# Patient Record
Sex: Male | Born: 1980
Health system: Southern US, Community
[De-identification: ages and names within clinical notes are randomized; demographics above are authoritative.]

## PROBLEM LIST (undated history)

## (undated) HISTORY — PX: SKIN GRAFT: SHX250

---

## 2012-07-24 ENCOUNTER — Emergency Department (HOSPITAL_BASED_OUTPATIENT_CLINIC_OR_DEPARTMENT_OTHER): Payer: Self-pay

## 2012-07-24 ENCOUNTER — Emergency Department (HOSPITAL_BASED_OUTPATIENT_CLINIC_OR_DEPARTMENT_OTHER)
Admission: EM | Admit: 2012-07-24 | Discharge: 2012-07-24 | Disposition: A | Discharge status: Home / Self Care | Payer: No Typology Code available for payment source | Attending: Emergency Medicine | Admitting: Emergency Medicine

## 2012-07-24 ENCOUNTER — Encounter (HOSPITAL_BASED_OUTPATIENT_CLINIC_OR_DEPARTMENT_OTHER): Payer: Self-pay | Admitting: *Deleted

## 2012-07-24 DIAGNOSIS — T07XXXA Unspecified multiple injuries, initial encounter: Secondary | ICD-10-CM

## 2012-07-24 DIAGNOSIS — T24339A Burn of third degree of unspecified lower leg, initial encounter: Secondary | ICD-10-CM | POA: Insufficient documentation

## 2012-07-24 DIAGNOSIS — L089 Local infection of the skin and subcutaneous tissue, unspecified: Secondary | ICD-10-CM | POA: Insufficient documentation

## 2012-07-24 DIAGNOSIS — T310 Burns involving less than 10% of body surface: Secondary | ICD-10-CM | POA: Insufficient documentation

## 2012-07-24 DIAGNOSIS — Y93I9 Activity, other involving external motion: Secondary | ICD-10-CM | POA: Insufficient documentation

## 2012-07-24 DIAGNOSIS — S60919A Unspecified superficial injury of unspecified wrist, initial encounter: Secondary | ICD-10-CM | POA: Insufficient documentation

## 2012-07-24 DIAGNOSIS — Y92410 Unspecified street and highway as the place of occurrence of the external cause: Secondary | ICD-10-CM | POA: Insufficient documentation

## 2012-07-24 DIAGNOSIS — T148XXA Other injury of unspecified body region, initial encounter: Secondary | ICD-10-CM | POA: Insufficient documentation

## 2012-07-24 DIAGNOSIS — Y998 Other external cause status: Secondary | ICD-10-CM | POA: Insufficient documentation

## 2012-07-24 DIAGNOSIS — T300 Burn of unspecified body region, unspecified degree: Secondary | ICD-10-CM

## 2012-07-24 MED ORDER — SILVER SULFADIAZINE 1 % EX CREA: TOPICAL_CREAM | Freq: Two times a day (BID) | CUTANEOUS | Status: DC

## 2012-07-24 MED ORDER — OXYCODONE-ACETAMINOPHEN 5-325 MG PO TABS: 1.0000 | ORAL_TABLET | Freq: Four times a day (QID) | ORAL | Status: AC | PRN

## 2012-07-24 MED ORDER — SILVER SULFADIAZINE 1 % EX CREA
TOPICAL_CREAM | CUTANEOUS | Status: AC
  Filled 2012-07-24: qty 85
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
## 2012-07-24 NOTE — ED Notes (Signed)
Cleaned abrasions on R elbow, R hip and R interior and lateral knee. Applied Bacitracin and clean dressing.

## 2012-08-11 ENCOUNTER — Emergency Department (HOSPITAL_BASED_OUTPATIENT_CLINIC_OR_DEPARTMENT_OTHER)
Admission: EM | Admit: 2012-08-11 | Discharge: 2012-08-11 | Disposition: A | Discharge status: Home / Self Care | Payer: Self-pay | Attending: Emergency Medicine | Admitting: Emergency Medicine

## 2012-08-11 ENCOUNTER — Encounter (HOSPITAL_BASED_OUTPATIENT_CLINIC_OR_DEPARTMENT_OTHER): Payer: Self-pay | Admitting: *Deleted

## 2012-08-11 DIAGNOSIS — T310 Burns involving less than 10% of body surface: Secondary | ICD-10-CM | POA: Insufficient documentation

## 2012-08-11 DIAGNOSIS — T24001A Burn of unspecified degree of unspecified site of right lower limb, except ankle and foot, initial encounter: Secondary | ICD-10-CM

## 2012-08-11 DIAGNOSIS — X19XXXA Contact with other heat and hot substances, initial encounter: Secondary | ICD-10-CM | POA: Insufficient documentation

## 2012-08-11 DIAGNOSIS — T24339A Burn of third degree of unspecified lower leg, initial encounter: Secondary | ICD-10-CM | POA: Insufficient documentation

## 2012-08-11 MED ORDER — SILVER SULFADIAZINE 1 % EX CREA
TOPICAL_CREAM | CUTANEOUS | Status: AC
  Filled 2012-08-11: qty 85

## 2012-08-11 MED ORDER — OXYCODONE-ACETAMINOPHEN 5-325 MG PO TABS
2.0000 | ORAL_TABLET | Freq: Once | ORAL | Status: DC
  Filled 2012-08-11 (×2): qty 2

## 2012-08-11 MED ORDER — OXYCODONE-ACETAMINOPHEN 5-325 MG PO TABS: 2.0000 | ORAL_TABLET | ORAL | Status: AC | PRN
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
## 2017-06-28 ENCOUNTER — Emergency Department (HOSPITAL_BASED_OUTPATIENT_CLINIC_OR_DEPARTMENT_OTHER)
Admission: EM | Admit: 2017-06-28 | Discharge: 2017-06-28 | Disposition: A | Discharge status: Home / Self Care | Payer: Self-pay | Attending: Emergency Medicine | Admitting: Emergency Medicine

## 2017-06-28 ENCOUNTER — Emergency Department (HOSPITAL_BASED_OUTPATIENT_CLINIC_OR_DEPARTMENT_OTHER): Payer: Self-pay

## 2017-06-28 ENCOUNTER — Encounter (HOSPITAL_BASED_OUTPATIENT_CLINIC_OR_DEPARTMENT_OTHER): Payer: Self-pay

## 2017-06-28 DIAGNOSIS — R072 Precordial pain: Secondary | ICD-10-CM | POA: Insufficient documentation

## 2017-06-28 LAB — CK: Total CK: 375 U/L (ref 49–397)

## 2017-06-28 LAB — CBC
HCT: 36.9 % — ABNORMAL LOW (ref 39.0–52.0)
Hemoglobin: 13 g/dL (ref 13.0–17.0)
MCH: 31.2 pg (ref 26.0–34.0)
MCHC: 35.2 g/dL (ref 30.0–36.0)
MCV: 88.5 fL (ref 78.0–100.0)
PLATELETS: 186 10*3/uL (ref 150–400)
RBC: 4.17 MIL/uL — ABNORMAL LOW (ref 4.22–5.81)
RDW: 14.1 % (ref 11.5–15.5)
WBC: 8 10*3/uL (ref 4.0–10.5)

## 2017-06-28 LAB — BASIC METABOLIC PANEL
ANION GAP: 8 (ref 5–15)
BUN: 9 mg/dL (ref 6–20)
CALCIUM: 9.1 mg/dL (ref 8.9–10.3)
CO2: 27 mmol/L (ref 22–32)
Chloride: 104 mmol/L (ref 101–111)
Creatinine, Ser: 0.85 mg/dL (ref 0.61–1.24)
GFR calc Af Amer: >60 mL/min (ref >60)
GFR calc non Af Amer: >60 mL/min (ref >60)
Glucose, Bld: 110 mg/dL — ABNORMAL HIGH (ref 65–99)
POTASSIUM: 3.3 mmol/L — AB (ref 3.5–5.1)
Sodium: 139 mmol/L (ref 135–145)

## 2017-06-28 LAB — TROPONIN I: Troponin I: <0.03 ng/mL (ref <0.03)

## 2017-06-28 MED ORDER — SODIUM CHLORIDE 0.9 % IV BOLUS (SEPSIS)
1000.0000 mL | Freq: Once | INTRAVENOUS | Status: AC
  Administered 2017-06-28: 1000 mL via INTRAVENOUS

## 2017-06-28 MED ORDER — KETOROLAC TROMETHAMINE 30 MG/ML IJ SOLN
30.0000 mg | Freq: Once | INTRAMUSCULAR | Status: AC
  Administered 2017-06-28: 30 mg via INTRAVENOUS
  Filled 2017-06-28: qty 1

## 2017-06-28 MED ORDER — IBUPROFEN 600 MG PO TABS: 600.0000 mg | ORAL_TABLET | Freq: Three times a day (TID) | ORAL | 0 refills | Status: DC | PRN

## 2017-06-28 NOTE — ED Triage Notes (Signed)
Pt c/o mid sternal pain described as pressure which started last night. Pt reports associated ShOB. Pt denies nausea or diaphoresis.

## 2017-06-28 NOTE — ED Provider Notes (Signed)
[facility]   CSN: [id number] Arrival date & time: 06/28/17  0052     History   Chief Complaint Chief Complaint  Patient presents with  . Chest Pain    HPI Steven Ferrell is a 36 y.o. male.  The history is provided by the patient.  Chest Pain   This is a new problem. The current episode started 12 to 24 hours ago. The problem occurs constantly. The problem has not changed since onset.The pain is associated with raising an arm. The pain is present in the substernal region. The pain is moderate. The quality of the pain is described as pressure-like. The pain does not radiate. The symptoms are aggravated by certain positions and deep breathing. Associated symptoms include shortness of breath. Pertinent negatives include no abdominal pain, no back pain, no diaphoresis, no fever, no hemoptysis, no lower extremity edema, no nausea, no syncope and no vomiting. He has tried nothing for the symptoms. Risk factors include male gender.  Pertinent negatives for past medical history include no CAD, no DVT and no PE.  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 SKIN GRAFT         Home Medications    Prior to Admission medications   Not on File    Family History No family history on file.  Social History Social History  Substance Use Topics  . Smoking status: Never Smoker  . Smokeless tobacco: Not on file  . Alcohol use No     Allergies   Patient has no known allergies.   Review of Systems Review of Systems  Constitutional: Negative for diaphoresis and fever.  Respiratory: Positive for shortness of breath. Negative for hemoptysis.     Cardiovascular: Positive for chest pain. Negative for leg swelling and syncope.  Gastrointestinal: Negative for abdominal pain, nausea and vomiting.  Musculoskeletal: Negative for back pain.  All other systems reviewed and are negative.    Physical Exam Updated Vital Signs BP 132/78   Pulse 67   Temp 98.4 F (36.9 C) (Oral)   Resp 18   Ht 1.88 m (6\' 2" )   Wt 68 kg (150 lb)   SpO2 100%   BMI 19.26 kg/m   Physical Exam  CONSTITUTIONAL: Well developed/well nourished, no distress HEAD: Normocephalic/atraumatic EYES: EOMI/PERRL ENMT: Mucous membranes moist NECK: supple no meningeal signs SPINE/BACK:entire spine nontender CV: S1/S2 noted, no murmurs/rubs/gallops noted Chest - no tenderness with palpation but pain reproduced with arm movement.  No crepitus or bruising noted LUNGS: Lungs are clear to auscultation bilaterally, no apparent distress ABDOMEN: soft, nontender, no rebound or guarding, bowel sounds noted throughout abdomen GU:no cva tenderness NEURO: Pt is awake/alert/appropriate, moves all extremitiesx4.  No facial droop.   EXTREMITIES: pulses normal/equal, full ROM, no LE edema or tenderness SKIN: warm, color normal PSYCH: no abnormalities of mood noted, alert and oriented to situation  ED Treatments / Results  Labs (all labs ordered are listed, but only abnormal results are displayed) Labs Reviewed  BASIC METABOLIC PANEL - Abnormal; Notable for the following:       Result Value   Potassium 3.3 (*)    Glucose, Bld 110 (*)  All other components within normal limits  CBC - Abnormal; Notable for the following:    RBC 4.17 (*)    HCT 36.9 (*)    All other components within normal limits  TROPONIN I  CK    EKG  EKG Interpretation  Date/Time:  Friday June 28 2017 00:57:51 EDT Ventricular Rate:  86 PR Interval:    QRS Duration: 85 QT Interval:  358 QTC Calculation: 429 R Axis:   86 Text Interpretation:  Sinus rhythm ST elev, probable normal early  repol pattern No previous ECGs available Confirmed by Zadie RhineWickline, Starlee Corralejo ([id number]) on 06/28/2017 1:04:56 AM       Radiology Dg Chest 2 View  Result Date: 06/28/2017 CLINICAL DATA:  Acute onset of midsternal chest pain. Initial encounter. EXAM: CHEST  2 VIEW COMPARISON:  None. FINDINGS: The lungs are well-aerated and clear. There is no evidence of focal opacification, pleural effusion or pneumothorax. The heart is normal in size; the mediastinal contour is within normal limits. No acute osseous abnormalities are seen. IMPRESSION: No acute cardiopulmonary process seen. Electronically Signed   By: Roanna RaiderJeffery  Chang M.D.   On: 06/28/2017 01:34    Procedures Procedures    Medications Ordered in ED Medications  sodium chloride 0.9 % bolus 1,000 mL (1,000 mLs Intravenous New Bag/Given 06/28/17 0219)  ketorolac (TORADOL) 30 MG/ML injection 30 mg (30 mg Intravenous Given 06/28/17 0219)     Initial Impression / Assessment and Plan / ED Course  I have reviewed the triage vital signs and the nursing notes.  Pertinent labs & imaging results that were available during my care of the patient were reviewed by me and considered in my medical decision making (see chart for details).     Pt stable Appears PERC negative Low suspicion for ACS given history/exam He is low risk for ACS No signs of acute aortic dissection Will d/c home We discussed strict return precautions   At time of discharge, pt sleeping/no distress noted  Final Clinical Impressions(s) / ED Diagnoses   Final diagnoses:  Precordial pain    New Prescriptions New Prescriptions   IBUPROFEN (ADVIL,MOTRIN) 600 MG TABLET    Take 1 tablet (600 mg total) by mouth every 8 (eight) hours as needed for moderate pain.     Zadie RhineWickline, Elainah Rhyne, MD 06/28/17 [phone]

## 2017-06-28 NOTE — ED Notes (Signed)
ED Provider at bedside. 

## 2017-06-28 NOTE — Discharge Instructions (Signed)

## 2017-11-17 ENCOUNTER — Emergency Department (HOSPITAL_COMMUNITY): Payer: No Typology Code available for payment source

## 2017-11-17 ENCOUNTER — Encounter (HOSPITAL_COMMUNITY): Payer: Self-pay | Admitting: Emergency Medicine

## 2017-11-17 ENCOUNTER — Other Ambulatory Visit: Payer: Self-pay

## 2017-11-17 ENCOUNTER — Emergency Department (HOSPITAL_COMMUNITY)
Admission: EM | Admit: 2017-11-17 | Discharge: 2017-11-17 | Payer: No Typology Code available for payment source | Attending: Emergency Medicine | Admitting: Emergency Medicine

## 2017-11-17 DIAGNOSIS — Y999 Unspecified external cause status: Secondary | ICD-10-CM | POA: Insufficient documentation

## 2017-11-17 DIAGNOSIS — Y92410 Unspecified street and highway as the place of occurrence of the external cause: Secondary | ICD-10-CM | POA: Insufficient documentation

## 2017-11-17 DIAGNOSIS — R410 Disorientation, unspecified: Secondary | ICD-10-CM | POA: Insufficient documentation

## 2017-11-17 DIAGNOSIS — S27322A Contusion of lung, bilateral, initial encounter: Secondary | ICD-10-CM

## 2017-11-17 DIAGNOSIS — Y939 Activity, unspecified: Secondary | ICD-10-CM | POA: Diagnosis not present

## 2017-11-17 DIAGNOSIS — R0602 Shortness of breath: Secondary | ICD-10-CM | POA: Insufficient documentation

## 2017-11-17 DIAGNOSIS — Z041 Encounter for examination and observation following transport accident: Secondary | ICD-10-CM | POA: Diagnosis present

## 2017-11-17 DIAGNOSIS — Z23 Encounter for immunization: Secondary | ICD-10-CM | POA: Insufficient documentation

## 2017-11-17 LAB — CBC
HCT: 40.3 % (ref 39.0–52.0)
Hemoglobin: 13.9 g/dL (ref 13.0–17.0)
MCH: 30.8 pg (ref 26.0–34.0)
MCHC: 34.5 g/dL (ref 30.0–36.0)
MCV: 89.4 fL (ref 78.0–100.0)
PLATELETS: 208 10*3/uL (ref 150–400)
RBC: 4.51 MIL/uL (ref 4.22–5.81)
RDW: 14.3 % (ref 11.5–15.5)
WBC: 7.4 10*3/uL (ref 4.0–10.5)

## 2017-11-17 LAB — I-STAT ARTERIAL BLOOD GAS, ED
Acid-base deficit: 3 mmol/L — ABNORMAL HIGH (ref 0.0–2.0)
Bicarbonate: 23.4 mmol/L (ref 20.0–28.0)
O2 SAT: 100 %
PCO2 ART: 42.4 mmHg (ref 32.0–48.0)
PH ART: 7.345 — AB (ref 7.350–7.450)
PO2 ART: 275 mmHg — AB (ref 83.0–108.0)
Patient temperature: 97
TCO2: 25 mmol/L (ref 22–32)

## 2017-11-17 LAB — COMPREHENSIVE METABOLIC PANEL
ALBUMIN: 4.2 g/dL (ref 3.5–5.0)
ALT: 22 U/L (ref 17–63)
AST: 34 U/L (ref 15–41)
Alkaline Phosphatase: 48 U/L (ref 38–126)
Anion gap: 11 (ref 5–15)
BUN: 9 mg/dL (ref 6–20)
CHLORIDE: 105 mmol/L (ref 101–111)
CO2: 24 mmol/L (ref 22–32)
CREATININE: 1.21 mg/dL (ref 0.61–1.24)
Calcium: 9.1 mg/dL (ref 8.9–10.3)
GFR calc non Af Amer: >60 mL/min (ref >60)
Glucose, Bld: 112 mg/dL — ABNORMAL HIGH (ref 65–99)
Potassium: 3 mmol/L — ABNORMAL LOW (ref 3.5–5.1)
SODIUM: 140 mmol/L (ref 135–145)
Total Bilirubin: 0.5 mg/dL (ref 0.3–1.2)
Total Protein: 6.9 g/dL (ref 6.5–8.1)

## 2017-11-17 LAB — I-STAT CHEM 8, ED
BUN: 10 mg/dL (ref 6–20)
Calcium, Ion: 1.18 mmol/L (ref 1.15–1.40)
Chloride: 104 mmol/L (ref 101–111)
Creatinine, Ser: 1.4 mg/dL — ABNORMAL HIGH (ref 0.61–1.24)
GLUCOSE: 107 mg/dL — AB (ref 65–99)
HEMATOCRIT: 43 % (ref 39.0–52.0)
HEMOGLOBIN: 14.6 g/dL (ref 13.0–17.0)
POTASSIUM: 3.1 mmol/L — AB (ref 3.5–5.1)
SODIUM: 144 mmol/L (ref 135–145)
TCO2: 25 mmol/L (ref 22–32)

## 2017-11-17 LAB — URINALYSIS, ROUTINE W REFLEX MICROSCOPIC
BACTERIA UA: NONE SEEN
Bilirubin Urine: NEGATIVE
Glucose, UA: NEGATIVE mg/dL
KETONES UR: NEGATIVE mg/dL
LEUKOCYTES UA: NEGATIVE
Nitrite: NEGATIVE
PH: 6 (ref 5.0–8.0)
PROTEIN: NEGATIVE mg/dL
Specific Gravity, Urine: >1.046 — ABNORMAL HIGH (ref 1.005–1.030)

## 2017-11-17 LAB — PREPARE FRESH FROZEN PLASMA
UNIT DIVISION: 0
Unit division: 0

## 2017-11-17 LAB — TYPE AND SCREEN
ABO/RH(D): A POS
Antibody Screen: NEGATIVE
Unit division: 0
Unit division: 0

## 2017-11-17 LAB — BPAM FFP
BLOOD PRODUCT EXPIRATION DATE: 201811282359
Blood Product Expiration Date: 201812042359
ISSUE DATE / TIME: 201811250148
ISSUE DATE / TIME: 201811250148
UNIT TYPE AND RH: 6200
Unit Type and Rh: 600

## 2017-11-17 LAB — BPAM RBC
BLOOD PRODUCT EXPIRATION DATE: 201811292359
Blood Product Expiration Date: 201812052359
ISSUE DATE / TIME: 201811250147
ISSUE DATE / TIME: 201811250147
Unit Type and Rh: 9500
Unit Type and Rh: 9500

## 2017-11-17 LAB — I-STAT CG4 LACTIC ACID, ED: LACTIC ACID, VENOUS: 2.88 mmol/L — AB (ref 0.5–1.9)

## 2017-11-17 LAB — PROTIME-INR
INR: 0.96
PROTHROMBIN TIME: 12.7 s (ref 11.4–15.2)

## 2017-11-17 LAB — ABO/RH: ABO/RH(D): A POS

## 2017-11-17 LAB — ETHANOL: Alcohol, Ethyl (B): 202 mg/dL — ABNORMAL HIGH (ref <10)

## 2017-11-17 MED ORDER — FENTANYL CITRATE (PF) 100 MCG/2ML IJ SOLN
INTRAMUSCULAR | Status: AC
  Administered 2017-11-17: 50 ug
  Filled 2017-11-17: qty 2

## 2017-11-17 MED ORDER — CYCLOBENZAPRINE HCL 10 MG PO TABS: 10.0000 mg | ORAL_TABLET | Freq: Three times a day (TID) | ORAL | 0 refills | Status: DC | PRN

## 2017-11-17 MED ORDER — SODIUM CHLORIDE 0.9 % IV SOLN
INTRAVENOUS | Status: AC | PRN
  Administered 2017-11-17: 1000 mL via INTRAVENOUS

## 2017-11-17 MED ORDER — ETOMIDATE 2 MG/ML IV SOLN
20.0000 mg | Freq: Once | INTRAVENOUS | Status: AC
  Administered 2017-11-17: 20 mg via INTRAVENOUS

## 2017-11-17 MED ORDER — HALOPERIDOL LACTATE 5 MG/ML IJ SOLN
10.0000 mg | Freq: Once | INTRAMUSCULAR | Status: AC
  Administered 2017-11-17: 10 mg via INTRAVENOUS

## 2017-11-17 MED ORDER — MIDAZOLAM HCL 2 MG/2ML IJ SOLN
INTRAMUSCULAR | Status: AC
  Filled 2017-11-17: qty 4

## 2017-11-17 MED ORDER — PROPOFOL 1000 MG/100ML IV EMUL
INTRAVENOUS | Status: AC
  Filled 2017-11-17: qty 100

## 2017-11-17 MED ORDER — VECURONIUM BROMIDE 10 MG IV SOLR
10.0000 mg | Freq: Once | INTRAVENOUS | Status: AC
  Administered 2017-11-17: 10 mg via INTRAVENOUS

## 2017-11-17 MED ORDER — SUCCINYLCHOLINE CHLORIDE 20 MG/ML IJ SOLN
100.0000 mg | Freq: Once | INTRAMUSCULAR | Status: AC
  Administered 2017-11-17: 100 mg via INTRAVENOUS
  Filled 2017-11-17: qty 5

## 2017-11-17 MED ORDER — IBUPROFEN 800 MG PO TABS: 800.0000 mg | ORAL_TABLET | Freq: Three times a day (TID) | ORAL | 0 refills | Status: DC

## 2017-11-17 MED ORDER — MIDAZOLAM HCL 2 MG/2ML IJ SOLN
4.0000 mg | Freq: Once | INTRAMUSCULAR | Status: AC
  Administered 2017-11-17: 4 mg via INTRAVENOUS

## 2017-11-17 MED ORDER — TETANUS-DIPHTH-ACELL PERTUSSIS 5-2.5-18.5 LF-MCG/0.5 IM SUSP
0.5000 mL | Freq: Once | INTRAMUSCULAR | Status: AC
  Administered 2017-11-17: 0.5 mL via INTRAMUSCULAR

## 2017-11-17 MED ORDER — TETANUS-DIPHTH-ACELL PERTUSSIS 5-2.5-18.5 LF-MCG/0.5 IM SUSP
INTRAMUSCULAR | Status: AC
  Filled 2017-11-17: qty 0.5

## 2017-11-17 MED ORDER — FENTANYL CITRATE (PF) 100 MCG/2ML IJ SOLN
50.0000 ug | Freq: Once | INTRAMUSCULAR | Status: AC
  Administered 2017-11-17: 50 ug via INTRAVENOUS

## 2017-11-17 MED ORDER — VECURONIUM BROMIDE 10 MG IV SOLR
INTRAVENOUS | Status: AC
  Filled 2017-11-17: qty 10

## 2017-11-17 MED ORDER — HALOPERIDOL LACTATE 5 MG/ML IJ SOLN
5.0000 mg | Freq: Once | INTRAMUSCULAR | Status: AC
  Administered 2017-11-17: 5 mg via INTRAVENOUS
  Filled 2017-11-17: qty 1

## 2017-11-17 MED ORDER — PROPOFOL 1000 MG/100ML IV EMUL
5.0000 ug/kg/min | Freq: Once | INTRAVENOUS | Status: AC
  Administered 2017-11-17: 20 ug/kg/min via INTRAVENOUS

## 2017-11-17 MED ORDER — IOPAMIDOL (ISOVUE-300) INJECTION 61%
INTRAVENOUS | Status: AC
  Administered 2017-11-17: 100 mL
  Filled 2017-11-17: qty 100

## 2017-11-17 MED ORDER — HALOPERIDOL LACTATE 5 MG/ML IJ SOLN
INTRAMUSCULAR | Status: AC
  Filled 2017-11-17: qty 1

## 2017-11-17 NOTE — ED Notes (Signed)
Report received. Pt resting in stretcher with eyes closed. Resp e/u. Skin w/d. [employer] at bedside awaiting pt to wake up for dispo. Attempted to wake pt - pt groaned but no eye opening. VSS. Will monitor closely.

## 2017-11-17 NOTE — ED Notes (Signed)
Patient extubated at this time, MD, RT and RNs at bedside.

## 2017-11-17 NOTE — ED Notes (Signed)
Patient combative in CT.  Patient not following commands and not responding to sedation at this time.  Dr Blinda LeatherwoodPollina notified and new orders for midazolam and Vecuronium.
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
## 2017-11-17 NOTE — ED Notes (Signed)
Patient intubated with 7.5 ETT, 24 at the teeth, by Dr Blinda LeatherwoodPollina.
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
## 2017-11-17 NOTE — ED Notes (Signed)
Patient continues to be argumentative and refusing to be evaluated at this time.  Patient states that he is having some shortness of breath and can not breath.  States that the room is "too hot".  Patient not allowing RNs or MD to evaluate him.  Patient remains confused, stating that he "just spoke with his mother" in which he did not.  New orders for Haldol per Dr Blinda LeatherwoodPollina.

## 2017-11-17 NOTE — ED Notes (Signed)
Returned from CT.
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
## 2017-11-17 NOTE — ED Notes (Signed)
Dr Pollina given a copy of lactic acid results 2.88 

## 2017-11-17 NOTE — ED Notes (Signed)
Patient is argumentative and confused.  New orders per Dr Blinda LeatherwoodPollina for Haldol.

## 2017-11-17 NOTE — ED Notes (Signed)
To CT

## 2017-11-18 ENCOUNTER — Encounter (HOSPITAL_BASED_OUTPATIENT_CLINIC_OR_DEPARTMENT_OTHER): Payer: Self-pay

## 2018-05-23 ENCOUNTER — Encounter (HOSPITAL_BASED_OUTPATIENT_CLINIC_OR_DEPARTMENT_OTHER): Payer: Self-pay | Admitting: *Deleted

## 2018-05-23 ENCOUNTER — Emergency Department (HOSPITAL_BASED_OUTPATIENT_CLINIC_OR_DEPARTMENT_OTHER)
Admission: EM | Admit: 2018-05-23 | Discharge: 2018-05-23 | Disposition: A | Discharge status: Home / Self Care | Payer: Self-pay | Attending: Emergency Medicine | Admitting: Emergency Medicine

## 2018-05-23 ENCOUNTER — Other Ambulatory Visit: Payer: Self-pay

## 2018-05-23 DIAGNOSIS — Y939 Activity, unspecified: Secondary | ICD-10-CM | POA: Insufficient documentation

## 2018-05-23 DIAGNOSIS — X500XXA Overexertion from strenuous movement or load, initial encounter: Secondary | ICD-10-CM | POA: Insufficient documentation

## 2018-05-23 DIAGNOSIS — S29011A Strain of muscle and tendon of front wall of thorax, initial encounter: Secondary | ICD-10-CM

## 2018-05-23 DIAGNOSIS — Y990 Civilian activity done for income or pay: Secondary | ICD-10-CM | POA: Insufficient documentation

## 2018-05-23 DIAGNOSIS — Y92511 Restaurant or cafe as the place of occurrence of the external cause: Secondary | ICD-10-CM | POA: Insufficient documentation

## 2018-05-23 MED ORDER — CYCLOBENZAPRINE HCL 10 MG PO TABS: 10.0000 mg | ORAL_TABLET | Freq: Three times a day (TID) | ORAL | 0 refills | Status: DC | PRN

## 2018-05-23 MED FILL — CYCLOBENZAPRINE HCL 10 MG T: 10 | 6 days supply | Qty: 20 | Fill #0

## 2018-05-23 NOTE — ED Provider Notes (Signed)
[facility] EMERGENCY DEPARTMENT Provider Note   CSN: [id number] Arrival date & time: 05/23/18  1229     History   Chief Complaint No chief complaint on file.   HPI Anthem Collar is a 37 y.o. male.  HPI  Patient is a 37yo male with no significant past medical history who presents to the emergency department for evaluation of right lower rib pain.  He states that his symptoms began about 3 days ago.  Reports pain is dull and aching in nature, located over the right lateral rib cage and does not radiate.  Pain occurs with movement only, particularly twisting movements and standing from a seated position.  Pain is 7/10 in severity at its worst.  If he is completely still he has no pain.  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  He has been taking Aleve at home which initially helped him, but no longer is alleviating pain.  He denies fever, chills, shortness of breath, cough, chest tightness, chest pain, abdominal pain, nausea/vomiting, dysuria, urinary frequency, hematuria.  Denies history of PE/DVT, leg swelling, pleuritic pain, recent surgery, active cancer.  History reviewed. No pertinent past medical history.  There are no active problems to display for this patient.   Past Surgical History:  Procedure Laterality Date  . SKIN GRAFT          Home Medications    Prior to Admission medications   Medication Sig Start Date End Date Taking? Authorizing Provider  cyclobenzaprine (FLEXERIL) 10 MG tablet Take 1 tablet (10 mg total) by mouth 3 (three) times daily as needed for muscle spasms. 11/17/17   Gilda CreasePollina, Christopher J, MD  ibuprofen (ADVIL,MOTRIN) 600 MG tablet Take 1 tablet (600 mg total) by mouth every 8 (eight) hours as needed for moderate pain. 06/28/17   Zadie RhineWickline, Donald, MD  ibuprofen (ADVIL,MOTRIN) 800 MG tablet Take 1 tablet (800 mg total) by mouth 3 (three) times daily. 11/17/17   Gilda CreasePollina, Christopher  J, MD    Family History No family history on file.  Social History Social History   Tobacco Use  . Smoking status: Never Smoker  . Smokeless tobacco: Never Used  Substance Use Topics  . Alcohol use: Yes  . Drug use: Yes    Types: Methamphetamines     Allergies   Patient has no known allergies.   Review of Systems Review of Systems  Constitutional: Negative for chills and fever.  Eyes: Negative for visual disturbance.  Respiratory: Negative for chest tightness and shortness of breath.   Cardiovascular: Positive for chest pain (right lateral rib cage).  Gastrointestinal: Negative for abdominal pain, nausea and vomiting.  Genitourinary: Negative for difficulty urinating, dysuria, flank pain, frequency and hematuria.  Musculoskeletal: Negative for back pain and gait problem.  Skin: Negative for rash and wound.  Neurological: Negative for weakness and numbness.  Psychiatric/Behavioral: Negative for agitation.     Physical Exam Updated Vital Signs BP (!) 127/97   Pulse 88   Temp 98.5 F (36.9 C) (Oral)   Resp 16   Ht 6\' 2"  (1.88 m)   Wt 66.7 kg (147 lb 0.8 oz)   SpO2 100%   BMI 18.88 kg/m   Physical Exam  Constitutional: He is oriented to person, place, and time. He appears well-developed and well-nourished. No distress.  HENT:  Head: Normocephalic and atraumatic.  Mouth/Throat: Oropharynx is clear and moist.  Eyes: Pupils are equal, round, and reactive to light.  Right eye exhibits no discharge. Left eye exhibits no discharge.  Neck: Normal range of motion. Neck supple.  Cardiovascular: Normal rate and regular rhythm.  Pulmonary/Chest: Effort normal and breath sounds normal. No stridor. No respiratory distress. He has no wheezes. He has no rales.  Point tender to palpation over the intercostal space of the right lateral chest wall as depicted in image. No overlying rash, ecchymosis or break in skin.     Abdominal: Soft. There is no tenderness.    Musculoskeletal: Normal range of motion.  No midline T-spine or L-spine tenderness.  Neurological: He is alert and oriented to person, place, and time. Coordination normal.  Skin: Skin is warm and dry. He is not diaphoretic.  Psychiatric: He has a normal mood and affect. His behavior is normal.  Nursing note and vitals reviewed.    ED Treatments / Results  Labs (all labs ordered are listed, but only abnormal results are displayed) Labs Reviewed - No data to display  EKG None  Radiology No results found.  Procedures Procedures (including critical care time)  Medications Ordered in ED Medications - No data to display   Initial Impression / Assessment and Plan / ED Course  I have reviewed the triage vital signs and the nursing notes.  Pertinent labs & imaging results that were available during my care of the patient were reviewed by me and considered in my medical decision making (see chart for details).     Symptoms consistent with musculoskeletal pain given reproducible and  present only with movement. Likely related to heavy lifting at his job as a Public affairs consultant. No blunt trauma to the chest.  Lungs CTA. Offered CXR and patient declines. No concern for nephrolithiasis or UTI given presentation. Patient discharged with instructions for symptomatic management including NSAIDs, heat and muscle relaxer. Counseled him that muscle relaxer can make him drowsy and he should not drive, work or drink alcohol while taking it. Discharged with information to [facility] to establish primary care.   Final Clinical Impressions(s) / ED Diagnoses   Final diagnoses:  Intercostal muscle strain, initial encounter    ED Discharge Orders        Ordered    cyclobenzaprine (FLEXERIL) 10 MG tablet  3 times daily PRN     05/23/18 1351       Lawrence Marseilles 05/23/18 2033    Tegeler, Canary Brim, MD 05/24/18 [phone]

## 2018-05-23 NOTE — ED Triage Notes (Signed)
Right rib pain and back for a week. Pain increases with movement.

## 2018-05-23 NOTE — Discharge Instructions (Addendum)
You have muscle strain. Please take ibuprofen  every 6hrs for pain. I have written you a prescription for muscle relaxer medication called robaxin. This medication can make you drowsy so please do not drive, work or drink alcohol while taking it.  You can also apply heat to the rib cage to help with your symptoms.  Try not to over exert yourself at work. Avoid heavy lifting greater than 15lbs at work. I have written you a note for work.  Please schedule an appointment with a regular doctor to establish care and for follow up. I have listed the information to [facility], a clinic located in [place] across the street from [facility]. They accept patients who do not have insurance and can help you access medications at a reduced cost.    Return to the ER if you have any new or concerning symptoms like pain when you are not moving, trouble breathing, chest pain.

## 2019-02-10 IMAGING — DX DG CHEST 2V
2 series · 2 of 2 positions shown · non-contrast
Comparison: None.

CLINICAL DATA: Acute onset of midsternal chest pain. Initial
encounter.

EXAM:
CHEST  2 VIEW

[chest pa]
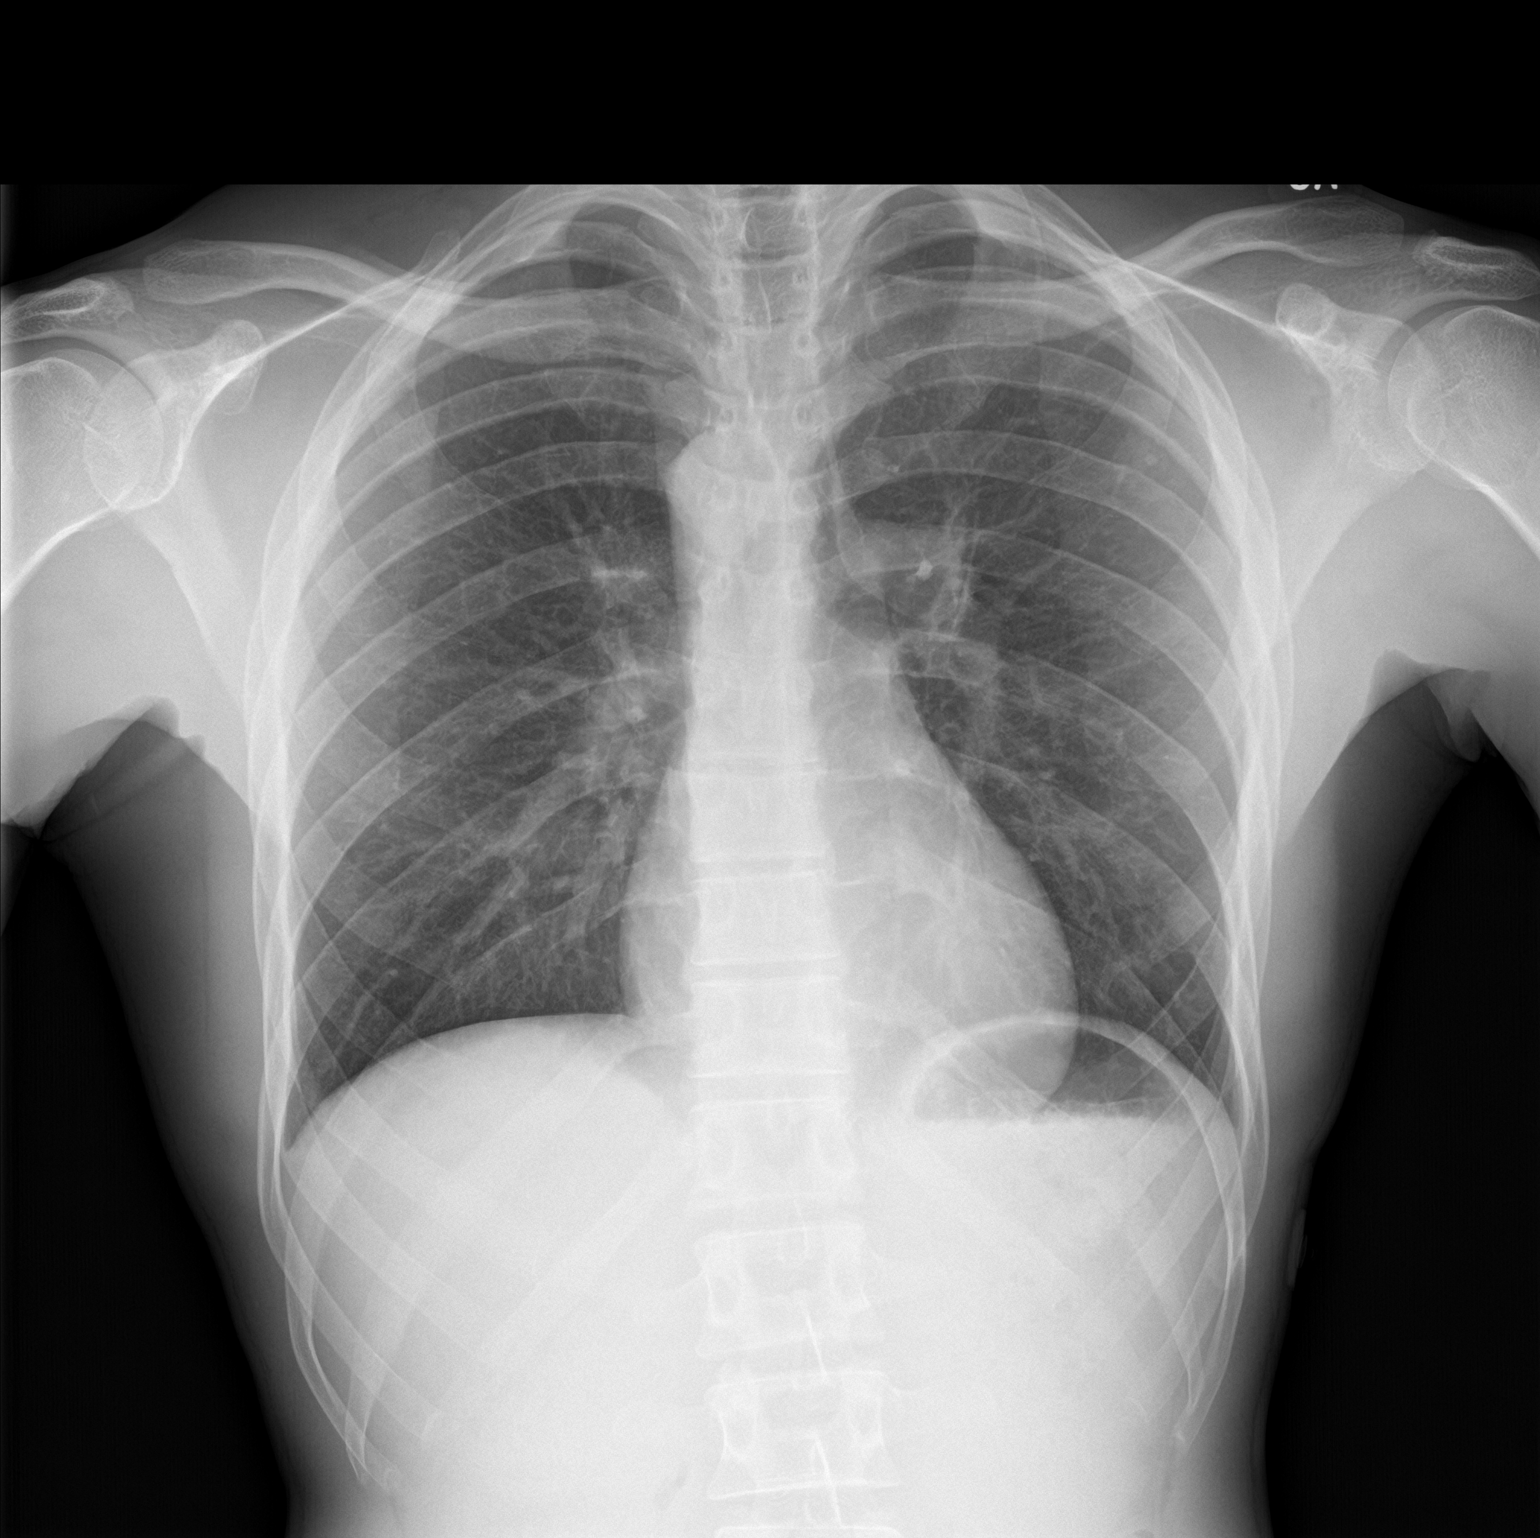

[chest lat]
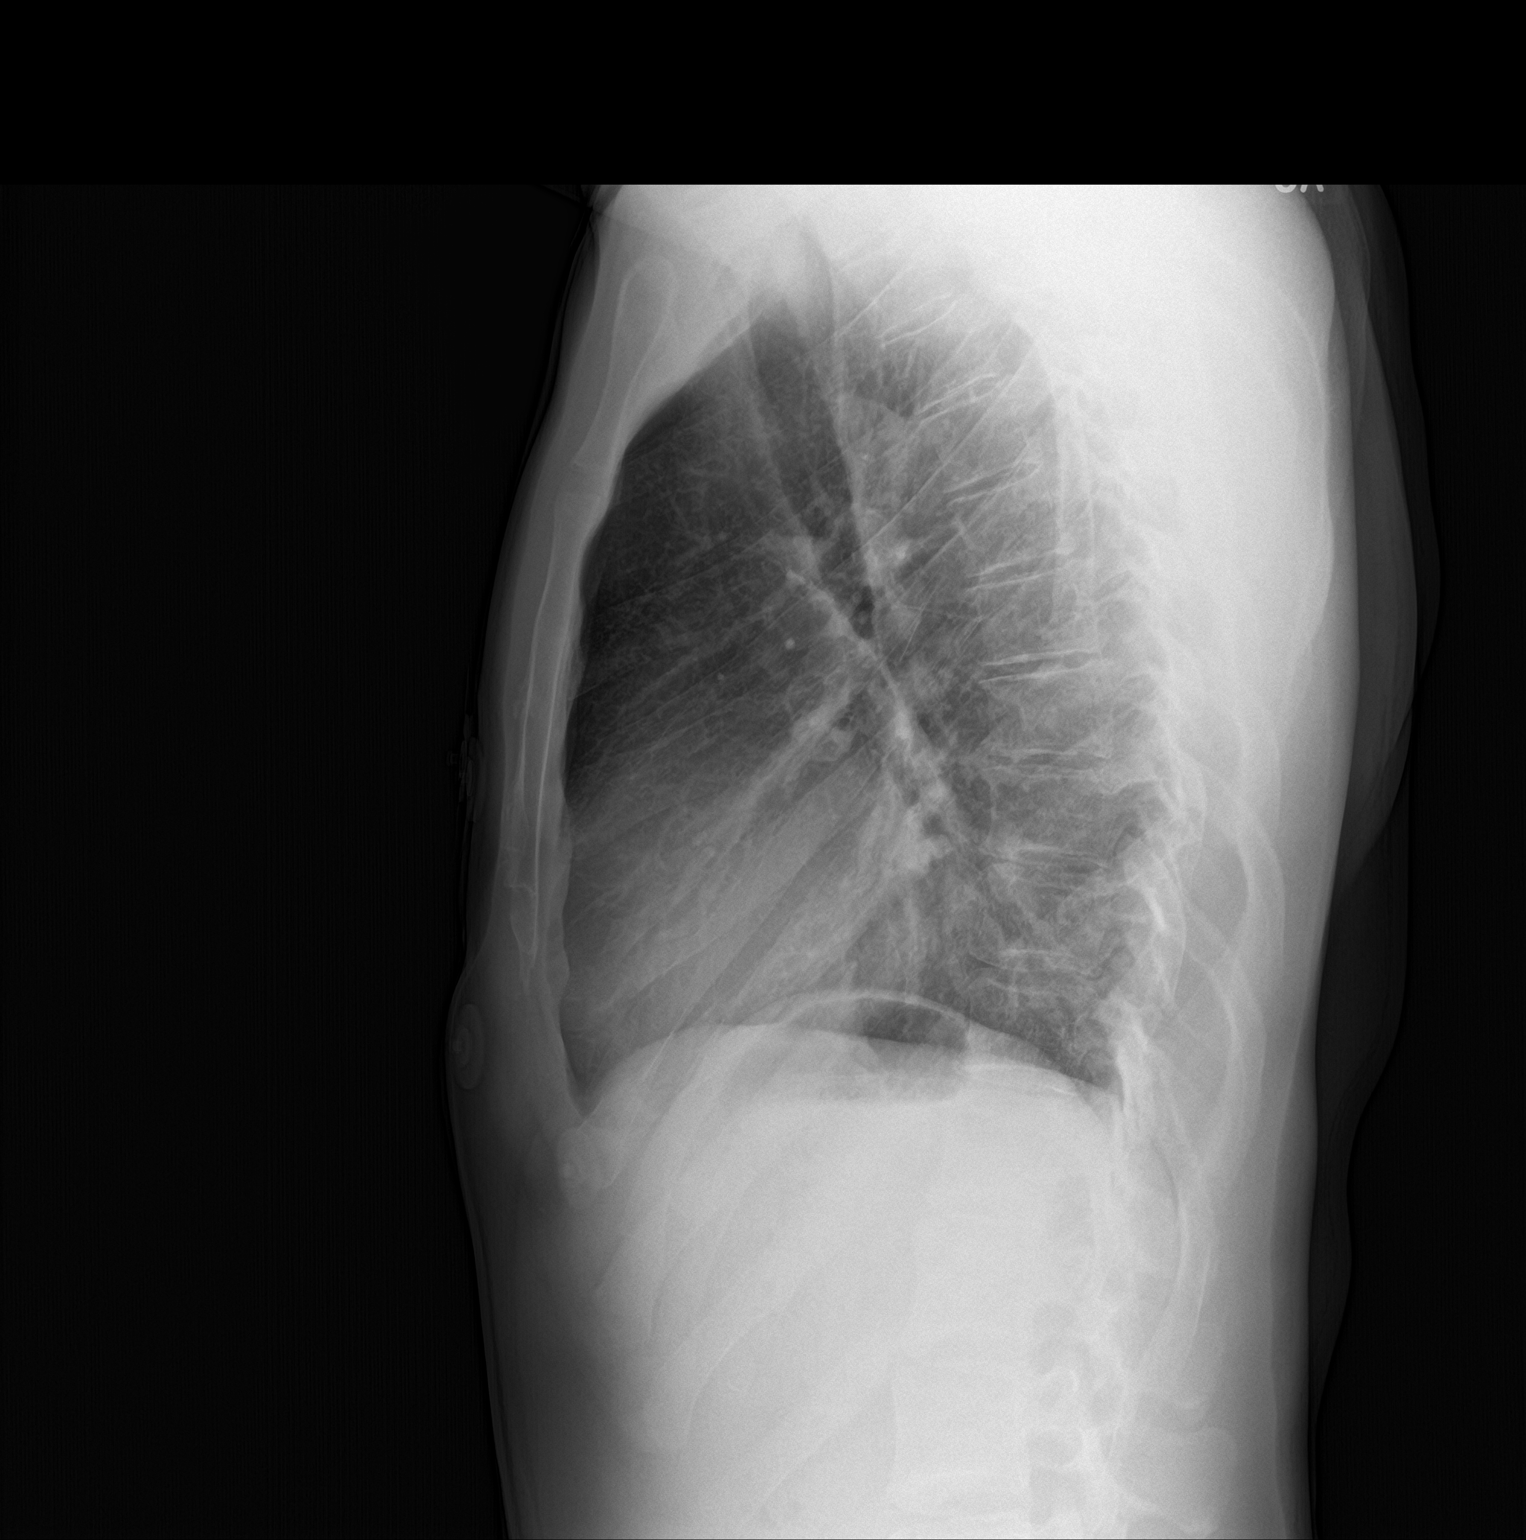

[2 of 2 positions shown; findings below may reference images not displayed]

FINDINGS: The lungs are well-aerated and clear. There is no evidence of focal
opacification, pleural effusion or pneumothorax.

The heart is normal in size; the mediastinal contour is within
normal limits. No acute osseous abnormalities are seen.
IMPRESSION: No acute cardiopulmonary process seen.

## 2020-03-16 ENCOUNTER — Emergency Department (HOSPITAL_BASED_OUTPATIENT_CLINIC_OR_DEPARTMENT_OTHER)
Admission: EM | Admit: 2020-03-16 | Discharge: 2020-03-16 | Disposition: A | Discharge status: Home / Self Care | Payer: Self-pay | Attending: Emergency Medicine | Admitting: Emergency Medicine

## 2020-03-16 ENCOUNTER — Encounter (HOSPITAL_BASED_OUTPATIENT_CLINIC_OR_DEPARTMENT_OTHER): Payer: Self-pay | Admitting: Emergency Medicine

## 2020-03-16 ENCOUNTER — Other Ambulatory Visit: Payer: Self-pay

## 2020-03-16 DIAGNOSIS — K209 Esophagitis, unspecified without bleeding: Secondary | ICD-10-CM | POA: Insufficient documentation

## 2020-03-16 DIAGNOSIS — Z87891 Personal history of nicotine dependence: Secondary | ICD-10-CM | POA: Insufficient documentation

## 2020-03-16 DIAGNOSIS — J029 Acute pharyngitis, unspecified: Secondary | ICD-10-CM | POA: Insufficient documentation

## 2020-03-16 MED ORDER — ALUM & MAG HYDROXIDE-SIMETH 200-200-20 MG/5ML PO SUSP
30.0000 mL | Freq: Once | ORAL | Status: AC
  Administered 2020-03-16: 30 mL via ORAL
  Filled 2020-03-16: qty 30

## 2020-03-16 MED ORDER — LIDOCAINE VISCOUS HCL 2 % MT SOLN
15.0000 mL | Freq: Once | OROMUCOSAL | Status: AC
  Administered 2020-03-16: 15 mL via ORAL
  Filled 2020-03-16: qty 15

## 2020-03-16 MED ORDER — SUCRALFATE 1 GM/10ML PO SUSP: 1.0000 g | Freq: Three times a day (TID) | ORAL | 0 refills | Status: DC

## 2020-03-16 MED ORDER — ACETAMINOPHEN 500 MG PO TABS
1000.0000 mg | ORAL_TABLET | Freq: Once | ORAL | Status: AC
Start: 2020-03-16 — End: 2020-03-16
  Administered 2020-03-16: 1000 mg via ORAL
  Filled 2020-03-16: qty 2

## 2020-03-16 NOTE — Discharge Instructions (Addendum)
Tylenol alternating with ibuprofen for pain and copious liquids. Do not drink alcohol

## 2020-03-16 NOTE — ED Provider Notes (Signed)
[facility] EMERGENCY DEPARTMENT Provider Note   CSN: [id number] Arrival date & time: 03/16/20  0114     History Chief Complaint  Patient presents with  . Sore Throat    Steven Ferrell is a 39 y.o. male.   Sore Throat This is a recurrent problem. The current episode started yesterday. The problem occurs constantly. The problem has not changed since onset.Pertinent negatives include no chest pain, no abdominal pain, no headaches and no shortness of breath. Nothing aggravates the symptoms. Nothing relieves the symptoms. He has tried nothing for the symptoms. The treatment provided no relief.  Patient states that when he drinks whiskey he gets a Sore throat and he drank Whiskey on Monday night and he awoke with a sore throat and it persisted.  He has been tested for covid 8 days ago as he is staying at [employer] [facility]. He is refusing that test at this time.  He can swallow his saliva and liquids.  No changes in phonation.  No f/c/r.  No neck pain or swelling.  No cough.  No sob.  No rashes.       History reviewed. No pertinent past medical history.  There are no problems to display for this patient.   Past Surgical History:  Procedure Laterality Date  . SKIN GRAFT         History reviewed. No pertinent family history.  Social History   Tobacco Use  . Smoking status: Former Smoker    Types: Cigarettes, Cigars  . Smokeless tobacco: Never Used  Substance Use Topics  . Alcohol use: Yes  . Drug use: Not Currently    Types: Methamphetamines    Home Medications Prior to Admission medications   Medication Sig Start Date End Date Taking? Authorizing Provider  cyclobenzaprine (FLEXERIL) 10 MG tablet Take 1 tablet (10 mg total) by mouth 3 (three) times daily as needed for muscle spasms. 05/23/18   Kellie Shropshire, PA-C  ibuprofen (ADVIL,MOTRIN) 600 MG tablet Take 1 tablet (600 mg total) by mouth every 8 (eight) hours as needed for moderate pain. 06/28/17    Zadie Rhine, MD  ibuprofen (ADVIL,MOTRIN) 800 MG tablet Take 1 tablet (800 mg total) by mouth 3 (three) times daily. 11/17/17   Gilda Crease, MD    Allergies    Patient has no known allergies.  Review of Systems   Review of Systems  Constitutional: Negative for appetite change, diaphoresis and fever.  HENT: Positive for sore throat. Negative for congestion, drooling, ear pain, trouble swallowing and voice change.   Eyes: Negative for visual disturbance.  Respiratory: Negative for shortness of breath.   Cardiovascular: Negative for chest pain.  Gastrointestinal: Negative for abdominal pain.  Genitourinary: Negative for difficulty urinating.  Musculoskeletal: Negative for arthralgias.  Neurological: Negative for headaches.  All other systems reviewed and are negative.   Physical Exam Updated Vital Signs BP 135/83 (BP Location: Right Arm)   Pulse 70   Temp 98.9 F (37.2 C) (Oral)   Resp 18   Ht 6\' 2"  (1.88 m)   Wt 65.8 kg   SpO2 98%   BMI 18.62 kg/m   Physical Exam Vitals and nursing note reviewed.  Constitutional:      General: He is not in acute distress.    Appearance: Normal appearance.     Comments: Intact phonation   HENT:     Head: Normocephalic and atraumatic.     Jaw: No trismus.     Nose: Nose normal.  Mouth/Throat:     Mouth: Mucous membranes are moist.     Pharynx: Oropharynx is clear. Uvula midline. No pharyngeal swelling, oropharyngeal exudate, posterior oropharyngeal erythema or uvula swelling.     Tonsils: No tonsillar exudate or tonsillar abscesses.  Eyes:     Conjunctiva/sclera: Conjunctivae normal.     Pupils: Pupils are equal, round, and reactive to light.  Neck:     Comments: No pain with displacement of the larynx Cardiovascular:     Rate and Rhythm: Normal rate and regular rhythm.     Pulses: Normal pulses.     Heart sounds: Normal heart sounds.  Pulmonary:     Effort: Pulmonary effort is normal.     Breath sounds:  Normal breath sounds.  Abdominal:     General: Abdomen is flat. Bowel sounds are normal.     Tenderness: There is no abdominal tenderness.  Musculoskeletal:        General: Normal range of motion.     Cervical back: Normal range of motion and neck supple. No rigidity.  Lymphadenopathy:     Cervical: No cervical adenopathy.  Skin:    General: Skin is warm and dry.     Capillary Refill: Capillary refill takes less than 2 seconds.  Neurological:     General: No focal deficit present.     Mental Status: He is alert and oriented to person, place, and time.  Psychiatric:        Mood and Affect: Mood normal.        Behavior: Behavior normal.     ED Results / Procedures / Treatments   Labs (all labs ordered are listed, but only abnormal results are displayed) Labs Reviewed - No data to display  EKG None  Radiology No results found.  Procedures Procedures (including critical care time)  Medications Ordered in ED Medications  acetaminophen (TYLENOL) tablet 1,000 mg (has no administration in time range)  alum & mag hydroxide-simeth (MAALOX/MYLANTA) 200-200-20 MG/5ML suspension 30 mL (30 mLs Oral Given 03/16/20 0158)    And  lidocaine (XYLOCAINE) 2 % viscous mouth solution 15 mL (15 mLs Oral Given 03/16/20 0158)    ED Course  I have reviewed the triage vital signs and the nursing notes.  Pertinent labs & imaging results that were available during my care of the patient were reviewed by me and considered in my medical decision making (see chart for details).   Well appearing, patient has refused covid. No signs of strep.  Given this has happened before with whisky I suspect a chemical esophagitis and will treat with tylenol and carafate. No signs or symptoms of epiglottitis.  No indication for labs or imaging at this time.  Improved post medication.  All questions answered to the patient's satisfaction.     Steven Ferrell was evaluated in Emergency Department on 03/16/2020 for  the symptoms described in the history of present illness. He was evaluated in the context of the global COVID-19 pandemic, which necessitated consideration that the patient might be at risk for infection with the SARS-CoV-2 virus that causes COVID-19. Institutional protocols and algorithms that pertain to the evaluation of patients at risk for COVID-19 are in a state of rapid change based on information released by regulatory bodies including the CDC and federal and state organizations. These policies and algorithms were followed during the patient's care in the ED.  Final Clinical Impression(s) / ED Diagnoses Return for weakness, numbness, changes in vision or speech, fevers >100.4 unrelieved by medication, shortness  of breath, intractable vomiting, or diarrhea, abdominal pain, Inability to tolerate liquids or food, cough, altered mental status or any concerns. No signs of systemic illness or infection. The patient is nontoxic-appearing on exam and vital signs are within normal limits.   I have reviewed the triage vital signs and the nursing notes. Pertinent labs &imaging results that were available during my care of the patient were reviewed by me and considered in my medical decision making (see chart for details).  After history, exam, and medical workup I feel the patient has been appropriately medically screened and is safe for discharge home. Pertinent diagnoses were discussed with the patient. Patient was givenstrictreturn precautions.       Temeca Somma, MD 03/16/20 7409

## 2020-03-16 NOTE — ED Triage Notes (Signed)
Patient arrived via POV c/o sore throat x 1 day post night drinking. Patient states having dry throat with difficulty producing salvia. Patient states he has had this before. Patient denies NVD, cough, fever. Patient AO x 4, VS WDL, normal gait.

## 2020-09-29 ENCOUNTER — Other Ambulatory Visit: Payer: Self-pay

## 2020-09-29 ENCOUNTER — Encounter (HOSPITAL_BASED_OUTPATIENT_CLINIC_OR_DEPARTMENT_OTHER): Payer: Self-pay | Admitting: Emergency Medicine

## 2020-09-29 ENCOUNTER — Emergency Department (HOSPITAL_BASED_OUTPATIENT_CLINIC_OR_DEPARTMENT_OTHER)
Admission: EM | Admit: 2020-09-29 | Discharge: 2020-09-29 | Disposition: A | Discharge status: Home / Self Care | Payer: Self-pay | Attending: Emergency Medicine | Admitting: Emergency Medicine

## 2020-09-29 DIAGNOSIS — Y92009 Unspecified place in unspecified non-institutional (private) residence as the place of occurrence of the external cause: Secondary | ICD-10-CM | POA: Insufficient documentation

## 2020-09-29 DIAGNOSIS — M546 Pain in thoracic spine: Secondary | ICD-10-CM | POA: Insufficient documentation

## 2020-09-29 DIAGNOSIS — S39011A Strain of muscle, fascia and tendon of abdomen, initial encounter: Secondary | ICD-10-CM | POA: Insufficient documentation

## 2020-09-29 DIAGNOSIS — M62830 Muscle spasm of back: Secondary | ICD-10-CM | POA: Insufficient documentation

## 2020-09-29 DIAGNOSIS — T148XXA Other injury of unspecified body region, initial encounter: Secondary | ICD-10-CM

## 2020-09-29 DIAGNOSIS — F17210 Nicotine dependence, cigarettes, uncomplicated: Secondary | ICD-10-CM | POA: Insufficient documentation

## 2020-09-29 DIAGNOSIS — F17290 Nicotine dependence, other tobacco product, uncomplicated: Secondary | ICD-10-CM | POA: Insufficient documentation

## 2020-09-29 DIAGNOSIS — W230XXA Caught, crushed, jammed, or pinched between moving objects, initial encounter: Secondary | ICD-10-CM | POA: Insufficient documentation

## 2020-09-29 MED ORDER — NAPROXEN 500 MG PO TABS: 500.0000 mg | ORAL_TABLET | Freq: Two times a day (BID) | ORAL | 0 refills | Status: AC

## 2020-09-29 MED ORDER — METHOCARBAMOL 500 MG PO TABS
500.0000 mg | ORAL_TABLET | Freq: Three times a day (TID) | ORAL | 0 refills | Status: AC | PRN
Start: 2020-09-29 — End: ?

## 2020-09-29 NOTE — ED Triage Notes (Signed)
Pt states he has a pain in his back on the right side and under his arm area  Pt states the pain started after he moved a box of diapers about a week ago  Pt states he has taken tylenol extra strength at home without relief  Pt states the pain makes it hard for him to take a deep breath

## 2020-09-29 NOTE — ED Provider Notes (Signed)
[facility] EMERGENCY DEPARTMENT Provider Note   CSN: [id number] Arrival date & time: 09/29/20  0357     History Chief Complaint  Patient presents with  . Back Pain    Steven Ferrell is a 39 y.o. male.  Patient presents to the emergency department for evaluation of right-sided upper back pain.  Patient reports that he bent over to place a box on the ground and when he stood back up he felt a sudden sharp stabbing pain on the right upper back.  Pain has been persistent for a week.  It hurts when he moves or takes a deep breath.  He is not short of breath.        History reviewed. No pertinent past medical history.  There are no problems to display for this patient.   Past Surgical History:  Procedure Laterality Date  . SKIN GRAFT         History reviewed. No pertinent family history.  Social History   Tobacco Use  . Smoking status: Current Some Day Smoker    Types: Cigarettes, Cigars  . Smokeless tobacco: Never Used  Vaping Use  . Vaping Use: Never used  Substance Use Topics  . Alcohol use: Yes    Comment: occ  . Drug use: Not Currently    Types: Methamphetamines    Home Medications Prior to Admission medications   Medication Sig Start Date End Date Taking? Authorizing Provider  methocarbamol (ROBAXIN) 500 MG tablet Take 1 tablet (500 mg total) by mouth every 8 (eight) hours as needed for muscle spasms. 09/29/20   Gilda Crease, MD  naproxen (NAPROSYN) 500 MG tablet Take 1 tablet (500 mg total) by mouth 2 (two) times daily. 09/29/20   Gilda Crease, MD  sucralfate (CARAFATE) 1 GM/10ML suspension Take 10 mLs (1 g total) by mouth 4 (four) times daily -  with meals and at bedtime. 03/16/20 09/29/20  Palumbo, April, MD    Allergies    Patient has no known allergies.  Review of Systems   Review of Systems  Respiratory: Negative for shortness of breath.   Musculoskeletal: Positive for back pain.    Physical Exam Updated Vital  Signs BP 130/88 (BP Location: Left Arm)   Pulse 94   Temp 98.9 F (37.2 C) (Oral)   Resp 16   SpO2 99%   Physical Exam Vitals and nursing note reviewed.  Constitutional:      General: He is not in acute distress.    Appearance: Normal appearance. He is well-developed.  HENT:     Head: Normocephalic and atraumatic.     Right Ear: Hearing normal.     Left Ear: Hearing normal.     Nose: Nose normal.  Eyes:     Conjunctiva/sclera: Conjunctivae normal.     Pupils: Pupils are equal, round, and reactive to light.  Cardiovascular:     Rate and Rhythm: Regular rhythm.     Heart sounds: S1 normal and S2 normal. No murmur heard.  No friction rub. No gallop.   Pulmonary:     Effort: Pulmonary effort is normal. No respiratory distress.     Breath sounds: Normal breath sounds.  Chest:     Chest wall: No tenderness.  Abdominal:     General: Bowel sounds are normal.     Palpations: Abdomen is soft.     Tenderness: There is no abdominal tenderness. There is no guarding or rebound. Negative signs include Murphy's sign and McBurney's sign.  Hernia: No hernia is present.  Musculoskeletal:        General: Normal range of motion.     Cervical back: Normal range of motion and neck supple.     Thoracic back: Tenderness present.       Back:  Skin:    General: Skin is warm and dry.     Findings: No rash.  Neurological:     Mental Status: He is alert and oriented to person, place, and time.     GCS: GCS eye subscore is 4. GCS verbal subscore is 5. GCS motor subscore is 6.     Cranial Nerves: No cranial nerve deficit.     Sensory: No sensory deficit.     Coordination: Coordination normal.  Psychiatric:        Speech: Speech normal.        Behavior: Behavior normal.        Thought Content: Thought content normal.     ED Results / Procedures / Treatments   Labs (all labs ordered are listed, but only abnormal results are displayed) Labs Reviewed - No data to  display  EKG None  Radiology No results found.  Procedures Procedures (including critical care time)  Medications Ordered in ED Medications - No data to display  ED Course  I have reviewed the triage vital signs and the nursing notes.  Pertinent labs & imaging results that were available during my care of the patient were reviewed by me and considered in my medical decision making (see chart for details).    MDM Rules/Calculators/A&P                          Patient had sudden onset of right upper back pain after bending over to place a box on the ground.  Patient has point tenderness over the intercostal muscle areas where he is experiencing the pain.  No deformity.  Lungs are clear, no cough or congestion.  Vital signs are normal.  PERC and Wells negative.  Presentation and examination most consistent with muscle strain, likely spasm of intercostal muscle.  No work-up or imaging necessary.  Final Clinical Impression(s) / ED Diagnoses Final diagnoses:  Muscle strain  Muscle spasm of back    Rx / DC Orders ED Discharge Orders         Ordered    methocarbamol (ROBAXIN) 500 MG tablet  Every 8 hours PRN        09/29/20 0537    naproxen (NAPROSYN) 500 MG tablet  2 times daily        09/29/20 0537           Gilda Crease, MD 09/29/20 [phone]

## 2021-08-17 ENCOUNTER — Encounter (HOSPITAL_BASED_OUTPATIENT_CLINIC_OR_DEPARTMENT_OTHER): Payer: Self-pay | Admitting: Emergency Medicine

## 2021-08-17 ENCOUNTER — Emergency Department (HOSPITAL_BASED_OUTPATIENT_CLINIC_OR_DEPARTMENT_OTHER): Payer: Self-pay

## 2021-08-17 ENCOUNTER — Emergency Department (HOSPITAL_BASED_OUTPATIENT_CLINIC_OR_DEPARTMENT_OTHER)
Admission: EM | Admit: 2021-08-17 | Discharge: 2021-08-17 | Disposition: A | Discharge status: Home / Self Care | Payer: Self-pay | Attending: Emergency Medicine | Admitting: Emergency Medicine

## 2021-08-17 ENCOUNTER — Other Ambulatory Visit: Payer: Self-pay

## 2021-08-17 DIAGNOSIS — M62830 Muscle spasm of back: Secondary | ICD-10-CM | POA: Insufficient documentation

## 2021-08-17 DIAGNOSIS — F17210 Nicotine dependence, cigarettes, uncomplicated: Secondary | ICD-10-CM | POA: Insufficient documentation

## 2021-08-17 MED ORDER — LIDOCAINE 5 % EX PTCH: 1.0000 | MEDICATED_PATCH | CUTANEOUS | 0 refills | Status: AC

## 2021-08-17 MED ORDER — ACETAMINOPHEN 500 MG PO TABS
1000.0000 mg | ORAL_TABLET | Freq: Once | ORAL | Status: AC
  Administered 2021-08-17: 1000 mg via ORAL
  Filled 2021-08-17: qty 2

## 2021-08-17 MED ORDER — NAPROXEN 375 MG PO TABS: 375.0000 mg | ORAL_TABLET | Freq: Two times a day (BID) | ORAL | 0 refills | Status: AC

## 2021-08-17 MED ORDER — NAPROXEN 250 MG PO TABS
500.0000 mg | ORAL_TABLET | Freq: Once | ORAL | Status: AC
  Administered 2021-08-17: 500 mg via ORAL
  Filled 2021-08-17: qty 2

## 2021-08-17 MED ORDER — LIDOCAINE 5 % EX PTCH
1.0000 | MEDICATED_PATCH | CUTANEOUS | Status: DC
  Administered 2021-08-17: 1 via TRANSDERMAL
  Filled 2021-08-17: qty 1

## 2021-08-17 NOTE — ED Provider Notes (Addendum)
[facility] EMERGENCY DEPARTMENT Provider Note   CSN: [id number] Arrival date & time: 08/17/21  0358     History Chief Complaint  Patient presents with   Back Pain    Steven Ferrell is a 40 y.o. male.  The history is provided by the patient.  Back Pain Location:  Thoracic spine Quality:  Cramping Radiates to:  Does not radiate Pain severity:  Moderate Pain is:  Same all the time Onset quality:  Gradual Duration:  10 months Timing:  Constant Progression:  Waxing and waning Chronicity:  Chronic Context: twisting   Context: not recent illness and not recent injury   Relieved by:  Nothing Worsened by:  Bending, twisting, standing and movement Ineffective treatments:  None tried Associated symptoms: no abdominal pain, no abdominal swelling, no bladder incontinence, no bowel incontinence, no chest pain, no dysuria, no fever, no headaches, no leg pain, no numbness, no paresthesias, no pelvic pain, no perianal numbness, no tingling, no weakness and no weight loss   Associated symptoms comment:  No travel, no surgery, no leg pain or swelling.  No SOB No CP Risk factors: no hx of cancer and not obese   Patient seen for same in October but it did not get better and sleeping on the floor is causing trouble with his significant other who made him come in for recheck.  No f/c/r.  No weakness.  No numbness.  No Chest pain no SOB.      History reviewed. No pertinent past medical history.  There are no problems to display for this patient.   Past Surgical History:  Procedure Laterality Date   SKIN GRAFT         History reviewed. No pertinent family history.  Social History   Tobacco Use   Smoking status: Some Days    Types: Cigarettes, Cigars   Smokeless tobacco: Never  Vaping Use   Vaping Use: Never used  Substance Use Topics   Alcohol use: Yes    Comment: occ   Drug use: Not Currently    Types: Methamphetamines    Home Medications Prior to  Admission medications   Medication Sig Start Date End Date Taking? Authorizing Provider  lidocaine (LIDODERM) 5 % Place 1 patch onto the skin daily. Remove & Discard patch within 12 hours or as directed by MD 08/17/21  Yes Muadh Creasy, MD  naproxen (NAPROSYN) 375 MG tablet Take 1 tablet (375 mg total) by mouth 2 (two) times daily. 08/17/21  Yes Amadi Frady, MD  methocarbamol (ROBAXIN) 500 MG tablet Take 1 tablet (500 mg total) by mouth every 8 (eight) hours as needed for muscle spasms. 09/29/20   Gilda Crease, MD  naproxen (NAPROSYN) 500 MG tablet Take 1 tablet (500 mg total) by mouth 2 (two) times daily. 09/29/20   Gilda Crease, MD  sucralfate (CARAFATE) 1 GM/10ML suspension Take 10 mLs (1 g total) by mouth 4 (four) times daily -  with meals and at bedtime. 03/16/20 09/29/20  Giara Mcgaughey, MD    Allergies    Patient has no known allergies.  Review of Systems   Review of Systems  Constitutional:  Negative for fever and weight loss.  HENT:  Negative for facial swelling.   Eyes:  Negative for redness.  Respiratory:  Negative for cough, shortness of breath, wheezing and stridor.   Cardiovascular:  Negative for chest pain, palpitations and leg swelling.  Gastrointestinal:  Negative for abdominal pain and bowel incontinence.  Genitourinary:  Negative for  bladder incontinence, difficulty urinating, dysuria and pelvic pain.  Musculoskeletal:  Positive for back pain. Negative for neck pain and neck stiffness.  Skin:  Negative for rash.  Neurological:  Negative for tingling, weakness, numbness, headaches and paresthesias.  All other systems reviewed and are negative.  Physical Exam Updated Vital Signs BP (!) 146/104   Pulse (!) 56   Temp 98.3 F (36.8 C) (Oral)   Resp 16   Ht 6\' 2"  (1.88 m)   Wt 68 kg   SpO2 100%   BMI 19.26 kg/m   Physical Exam Vitals and nursing note reviewed.  Constitutional:      General: He is not in acute distress.    Appearance: Normal  appearance.  HENT:     Head: Normocephalic and atraumatic.     Nose: Nose normal.  Eyes:     Conjunctiva/sclera: Conjunctivae normal.     Pupils: Pupils are equal, round, and reactive to light.  Cardiovascular:     Rate and Rhythm: Normal rate and regular rhythm.     Pulses: Normal pulses.     Heart sounds: Normal heart sounds.  Pulmonary:     Effort: Pulmonary effort is normal.     Breath sounds: Normal breath sounds.  Abdominal:     General: Abdomen is flat. Bowel sounds are normal.     Palpations: Abdomen is soft.     Tenderness: There is no abdominal tenderness. There is no guarding.  Musculoskeletal:        General: No tenderness. Normal range of motion.     Cervical back: Normal, normal range of motion and neck supple.     Thoracic back: No swelling, edema, signs of trauma, tenderness or bony tenderness. Normal range of motion. No scoliosis.     Lumbar back: Normal.       Back:     Right lower leg: No edema.     Left lower leg: No edema.  Skin:    General: Skin is warm and dry.     Capillary Refill: Capillary refill takes less than 2 seconds.  Neurological:     General: No focal deficit present.     Mental Status: He is alert and oriented to person, place, and time.     Deep Tendon Reflexes: Reflexes normal.  Psychiatric:        Mood and Affect: Mood normal.        Behavior: Behavior normal.    ED Results / Procedures / Treatments   Labs (all labs ordered are listed, but only abnormal results are displayed) Labs Reviewed - No data to display  EKG None  Radiology No results found.  Procedures Procedures   Medications Ordered in ED Medications  naproxen (NAPROSYN) tablet 500 mg (has no administration in time range)  acetaminophen (TYLENOL) tablet 1,000 mg (has no administration in time range)  lidocaine (LIDODERM) 5 % 1 patch (has no administration in time range)    ED Course  I have reviewed the triage vital signs and the nursing notes.  Pertinent  labs & imaging results that were available during my care of the patient were reviewed by me and considered in my medical decision making (see chart for details).   PERC negative [place] 0, highly doubt PE in this low risk patient.  Symptoms are chronic in nature.  Imaging done as this is a repeat visit to exclude bony pathology.  I have informed the patient the bones of the spine were read as normal on imaging  and that the disk height was preserved.  Patient inquired about "curvature of the spine"  I informed him that the radiologist did not see any scoliosis or abnormal curvature of the spine.  Will refer to PMD for ongoing care.    Informed by nurse that patient accused EDP of lying because he states he has "shortening of the spine".  There is no shortening of the spine nor acute bony pathology and EDP read the Xray report  verbatim to patient.  I was truthful to the patient.  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  Moreover, today's Xray was compared to Ct scan of 11/17/2017 and no changes were seen and there was no bony pathology of the thoracic region at that time.  Steven Ferrell was evaluated in Emergency Department on 08/17/2021 for the symptoms described in the history of present illness. He was evaluated in the context of the global COVID-19 pandemic, which necessitated consideration that the patient might be at risk for infection with the SARS-CoV-2 virus that causes COVID-19. Institutional protocols and algorithms that pertain to the evaluation of patients at risk for COVID-19 are in a state of rapid change based on information released by regulatory bodies including the CDC and federal and state organizations. These policies and algorithms were followed during the patient's care in the ED.  Final Clinical Impression(s) / ED Diagnoses Final diagnoses:  None   Return for intractable cough, coughing up blood, fevers > 100.4 unrelieved  by medication, shortness of breath, intractable vomiting, chest pain, shortness of breath, weakness, numbness, changes in speech, facial asymmetry, abdominal pain, passing out, Inability to tolerate liquids or food, cough, altered mental status or any concerns. No signs of systemic illness or infection. The patient is nontoxic-appearing on exam and vital signs are within normal limits. I have reviewed the triage vital signs and the nursing notes. Pertinent labs & imaging results that were available during my care of the patient were reviewed by me and considered in my medical decision making (see chart for details). After history, exam, and medical workup I feel the patient has been appropriately medically screened and is safe for discharge home. Pertinent diagnoses were discussed with the patient. Patient was given return precautions. Rx / DC Orders ED Discharge Orders          Ordered    naproxen (NAPROSYN) 375 MG tablet  2 times daily        08/17/21 0432    lidocaine (LIDODERM) 5 %  Every 24 hours        08/17/21 0432               Jhoanna Heyde, MD 08/17/21 0086

## 2021-08-17 NOTE — ED Triage Notes (Signed)
Pt reports upper back pain between shoulder blades. Was seen here previously for same and given muscle relaxer which did not help

## 2022-01-21 ENCOUNTER — Other Ambulatory Visit: Payer: Self-pay

## 2022-01-21 DIAGNOSIS — N3000 Acute cystitis without hematuria: Secondary | ICD-10-CM | POA: Insufficient documentation

## 2022-01-22 ENCOUNTER — Encounter (HOSPITAL_BASED_OUTPATIENT_CLINIC_OR_DEPARTMENT_OTHER): Payer: Self-pay | Admitting: Emergency Medicine

## 2022-01-22 ENCOUNTER — Emergency Department (HOSPITAL_BASED_OUTPATIENT_CLINIC_OR_DEPARTMENT_OTHER)
Admission: EM | Admit: 2022-01-22 | Discharge: 2022-01-22 | Disposition: A | Discharge status: Home / Self Care | Payer: Self-pay | Attending: Emergency Medicine | Admitting: Emergency Medicine

## 2022-01-22 DIAGNOSIS — N3000 Acute cystitis without hematuria: Secondary | ICD-10-CM

## 2022-01-22 LAB — URINALYSIS, ROUTINE W REFLEX MICROSCOPIC
Glucose, UA: 100 mg/dL — AB
Ketones, ur: 15 mg/dL — AB
Nitrite: POSITIVE — AB
Protein, ur: >300 mg/dL — AB
Specific Gravity, Urine: 1.025 (ref 1.005–1.030)
pH: 5 (ref 5.0–8.0)

## 2022-01-22 LAB — URINALYSIS, MICROSCOPIC (REFLEX): WBC, UA: >50 WBC/hpf (ref 0–5)

## 2022-01-22 MED ORDER — FOSFOMYCIN TROMETHAMINE 3 G PO PACK
3.0000 g | PACK | Freq: Once | ORAL | Status: AC
  Administered 2022-01-22: 3 g via ORAL
  Filled 2022-01-22: qty 3

## 2022-01-22 MED ORDER — CEPHALEXIN 500 MG PO CAPS: 500.0000 mg | ORAL_CAPSULE | Freq: Two times a day (BID) | ORAL | 0 refills | Status: DC

## 2022-01-22 MED ORDER — CEFTRIAXONE SODIUM 1 G IJ SOLR: 1.0000 g | Freq: Once | INTRAMUSCULAR | Status: DC

## 2022-01-22 MED ORDER — CEPHALEXIN 250 MG PO CAPS: 1000.0000 mg | ORAL_CAPSULE | Freq: Once | ORAL | Status: DC

## 2022-01-22 NOTE — ED Triage Notes (Addendum)
Pt POV from home c/o urinary frequency, tingling with urination, cloudy urine x 3 days. Denies fevers, abdominal pain, penile discharge. Took AZO with some relief.

## 2022-01-22 NOTE — ED Provider Notes (Signed)
[place] EMERGENCY DEPARTMENT Provider Note   CSN: [id number] Arrival date & time: 01/21/22  2358     History  Chief Complaint  Patient presents with   Urinary Frequency    Steven Ferrell is a 41 y.o. male.  Patient presents to the emergency department for evaluation of dysuria and cloudy urine for 3 days.  No external penile lesions, no urethral discharge.  Patient reports that there is a slight burning sensation when he urinates and after he finishes urinating his bladder area cramps and causes increased pain.      Home Medications Prior to Admission medications   Medication Sig Start Date End Date Taking? Authorizing Provider  lidocaine (LIDODERM) 5 % Place 1 patch onto the skin daily. Remove & Discard patch within 12 hours or as directed by MD 08/17/21   Randal Buba, April, MD  methocarbamol (ROBAXIN) 500 MG tablet Take 1 tablet (500 mg total) by mouth every 8 (eight) hours as needed for muscle spasms. 09/29/20   Orpah Greek, MD  naproxen (NAPROSYN) 375 MG tablet Take 1 tablet (375 mg total) by mouth 2 (two) times daily. 08/17/21   Palumbo, April, MD  naproxen (NAPROSYN) 500 MG tablet Take 1 tablet (500 mg total) by mouth 2 (two) times daily. 09/29/20   Orpah Greek, MD  sucralfate (CARAFATE) 1 GM/10ML suspension Take 10 mLs (1 g total) by mouth 4 (four) times daily -  with meals and at bedtime. 03/16/20 09/29/20  Palumbo, April, MD      Allergies    Patient has no known allergies.    Review of Systems   Review of Systems  Genitourinary:  Positive for dysuria.   Physical Exam Updated Vital Signs BP (!) 130/96 (BP Location: Right Arm)    Pulse 73    Temp 98.1 F (36.7 C)    Resp 16    SpO2 98%  Physical Exam Vitals and nursing note reviewed.  Constitutional:      General: He is not in acute distress.    Appearance: He is well-developed.  HENT:     Head: Normocephalic and atraumatic.  Eyes:     Conjunctiva/sclera: Conjunctivae normal.   Cardiovascular:     Rate and Rhythm: Normal rate and regular rhythm.     Heart sounds: No murmur heard. Pulmonary:     Effort: Pulmonary effort is normal. No respiratory distress.     Breath sounds: Normal breath sounds.  Abdominal:     Palpations: Abdomen is soft.     Tenderness: There is no abdominal tenderness.  Musculoskeletal:        General: No swelling.     Cervical back: Neck supple.  Skin:    General: Skin is warm and dry.     Capillary Refill: Capillary refill takes less than 2 seconds.  Neurological:     Mental Status: He is alert.  Psychiatric:        Mood and Affect: Mood normal.    ED Results / Procedures / Treatments   Labs (all labs ordered are listed, but only abnormal results are displayed) Labs Reviewed  URINALYSIS, ROUTINE W REFLEX MICROSCOPIC - Abnormal; Notable for the following components:      Result Value   Color, Urine ORANGE (*)    APPearance TURBID (*)    Glucose, UA 100 (*)    Hgb urine dipstick MODERATE (*)    Bilirubin Urine SMALL (*)    Ketones, ur 15 (*)    Protein, ur >300 (*)  Nitrite POSITIVE (*)    Leukocytes,Ua LARGE (*)    All other components within normal limits  URINALYSIS, MICROSCOPIC (REFLEX) - Abnormal; Notable for the following components:   Bacteria, UA MANY (*)    All other components within normal limits  URINE CULTURE  GC/CHLAMYDIA PROBE AMP ([place]) NOT AT [facility]    EKG None  Radiology No results found.  Procedures Procedures    Medications Ordered in ED Medications  fosfomycin (MONUROL) packet 3 g (has no administration in time range)    ED Course/ Medical Decision Making/ A&P                           Medical Decision Making Amount and/or Complexity of Data Reviewed Labs: ordered.  Risk Prescription drug management.   Patient presents with dysuria.  Symptoms ongoing for 3 days.  He has noticed that his urine is cloudy and he is experiencing what sounds like bladder spasm at the end of  stream.  This is consistent with cystitis.  Urinalysis supports this diagnosis.  Will test urine for GC and chlamydia but he does not have any discharge or STD symptoms.  Declined Rocephin IM.  We will therefore treat with single dose fosfomycin.        Final Clinical Impression(s) / ED Diagnoses Final diagnoses:  Acute cystitis without hematuria    Rx / DC Orders ED Discharge Orders          Ordered    cephALEXin (KEFLEX) 500 MG capsule  2 times daily,   Status:  Discontinued        01/22/22 0107              Orpah Greek, MD 01/22/22 [phone]

## 2022-01-23 LAB — GC/CHLAMYDIA PROBE AMP (~~LOC~~) NOT AT ARMC
Chlamydia: NEGATIVE
Comment: NEGATIVE
Comment: NORMAL
Neisseria Gonorrhea: NEGATIVE

## 2022-01-24 LAB — URINE CULTURE: Culture: 80000 — AB

## 2022-01-25 ENCOUNTER — Telehealth: Payer: Self-pay | Admitting: *Deleted

## 2022-01-25 NOTE — Telephone Encounter (Signed)
Post ED Visit - Positive Culture Follow-up  Culture report reviewed by antimicrobial stewardship pharmacist: [facility] []  , Pharm.D. []  Enzo Bi, Pharm.D., BCPS AQ-ID []  , Pharm.D., BCPS []  Celedonio Miyamoto, .D., BCPS []  [place], .D., BCPS, AAHIVP []  Georgina Pillion, Pharm.D., BCPS, AAHIVP []  [address], PharmD, BCPS []  , PharmD, BCPS []  [place], PharmD, BCPS []  [address], PharmD []  , PharmD, BCPS []  Estella Husk, PharmD  Pharmacy Team []  Lysle Pearl, PharmD []  , PharmD []  Phillips Climes, PharmD []  , Rph []  Agapito Games) , PharmD []  Verlan Friends, PharmD []  , PharmD []  Mervyn Gay, PharmD []  , PharmD []  Vinnie Level, PharmD []  [facility], PharmD []  , PharmD []  Len Childs, PharmD   Positive urine culture Treated with Cephalexin, organism sensitive to the same and no further patient follow-up is required at this time.  , PharmD  Greer Pickerel Talley 01/25/2022, 7:56 AM

## 2023-04-01 IMAGING — DX DG CHEST 2V
2 series · 2 of 2 positions shown · non-contrast
Comparison: Chest radiographs 11/17/2017 and earlier, including CT
Chest, Abdomen, and Pelvis today are reported separately. 11/17/2017

CLINICAL DATA: 39-year-old male with upper back pain radiating
between the shoulder blades.

EXAM:
CHEST - 2 VIEW

[chest pa]
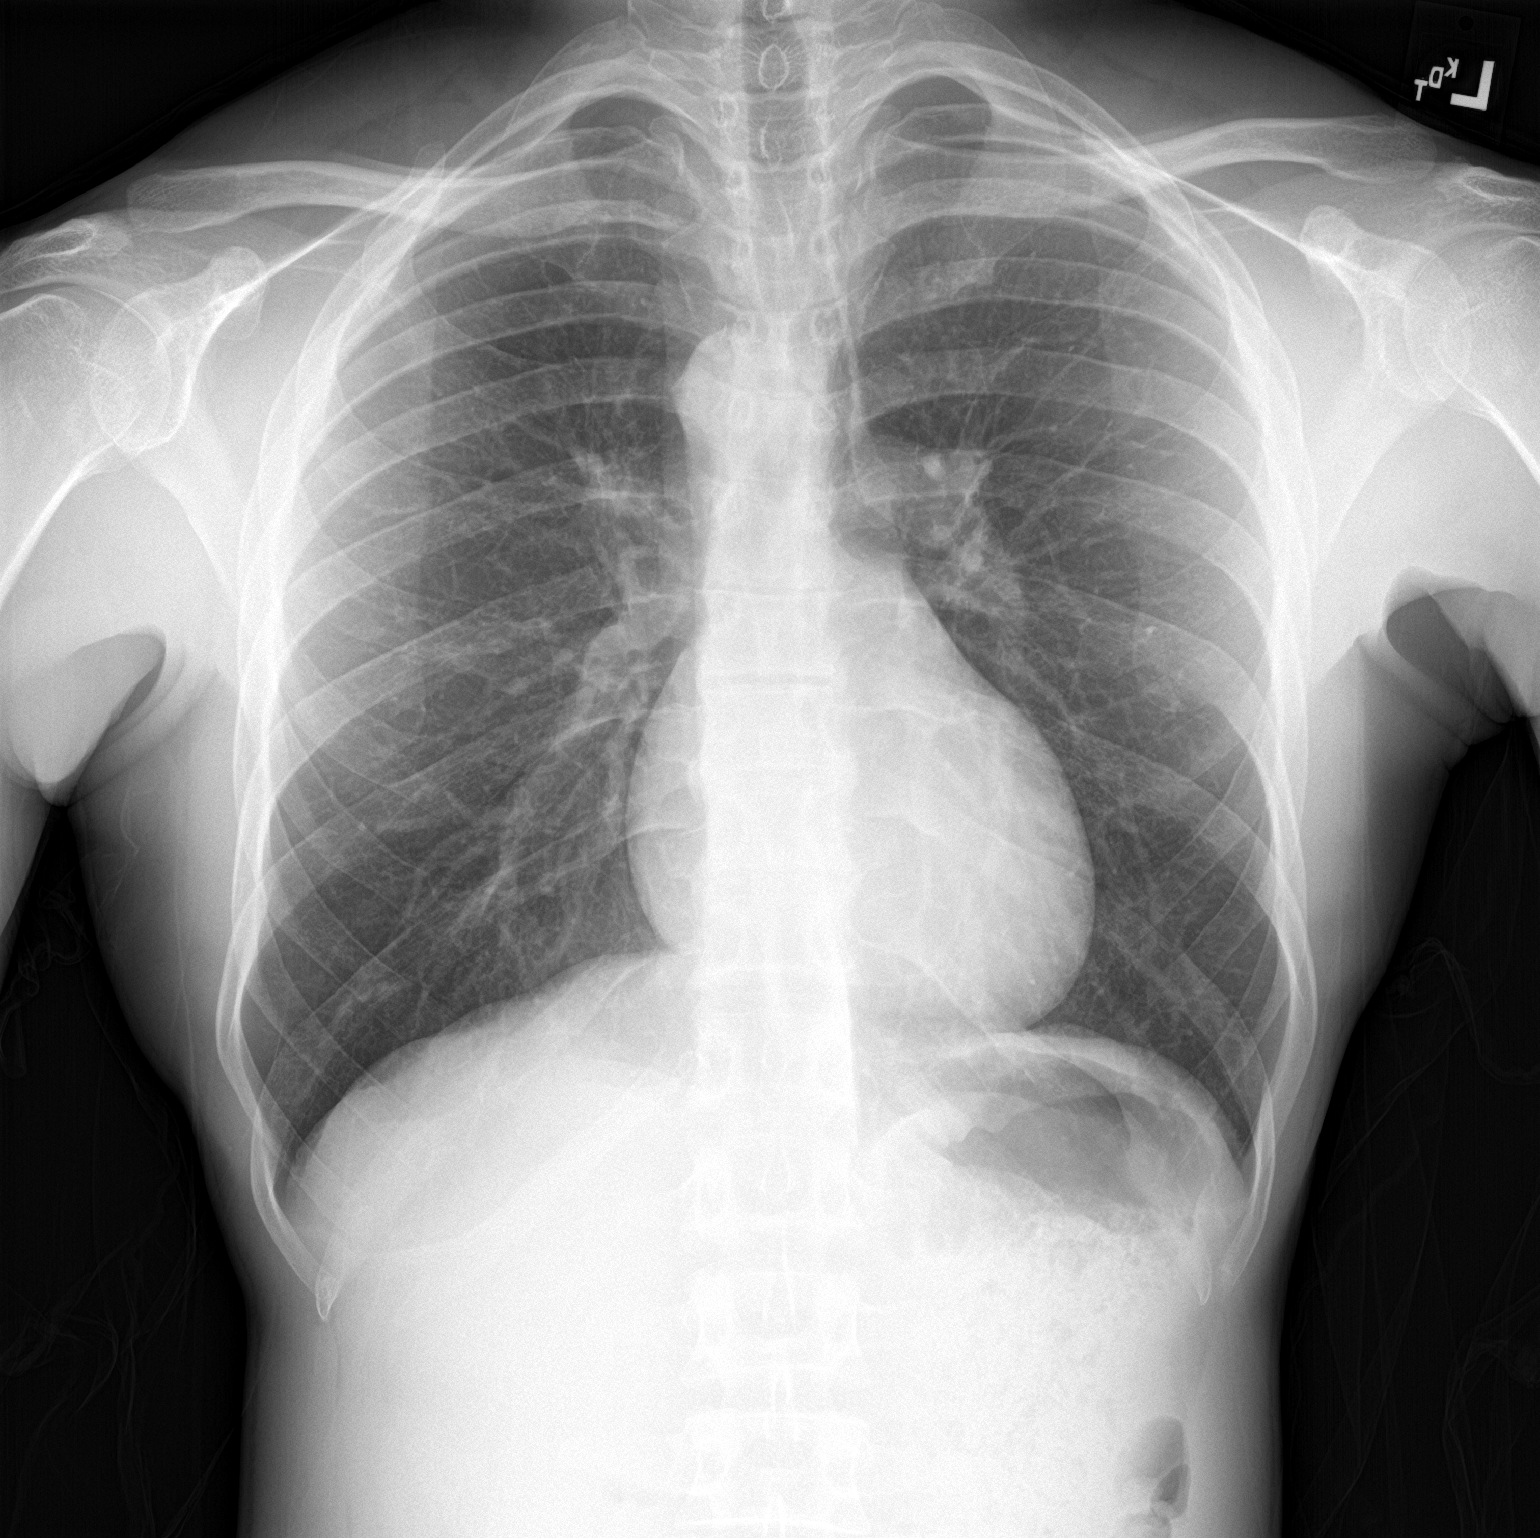

[chest lat]
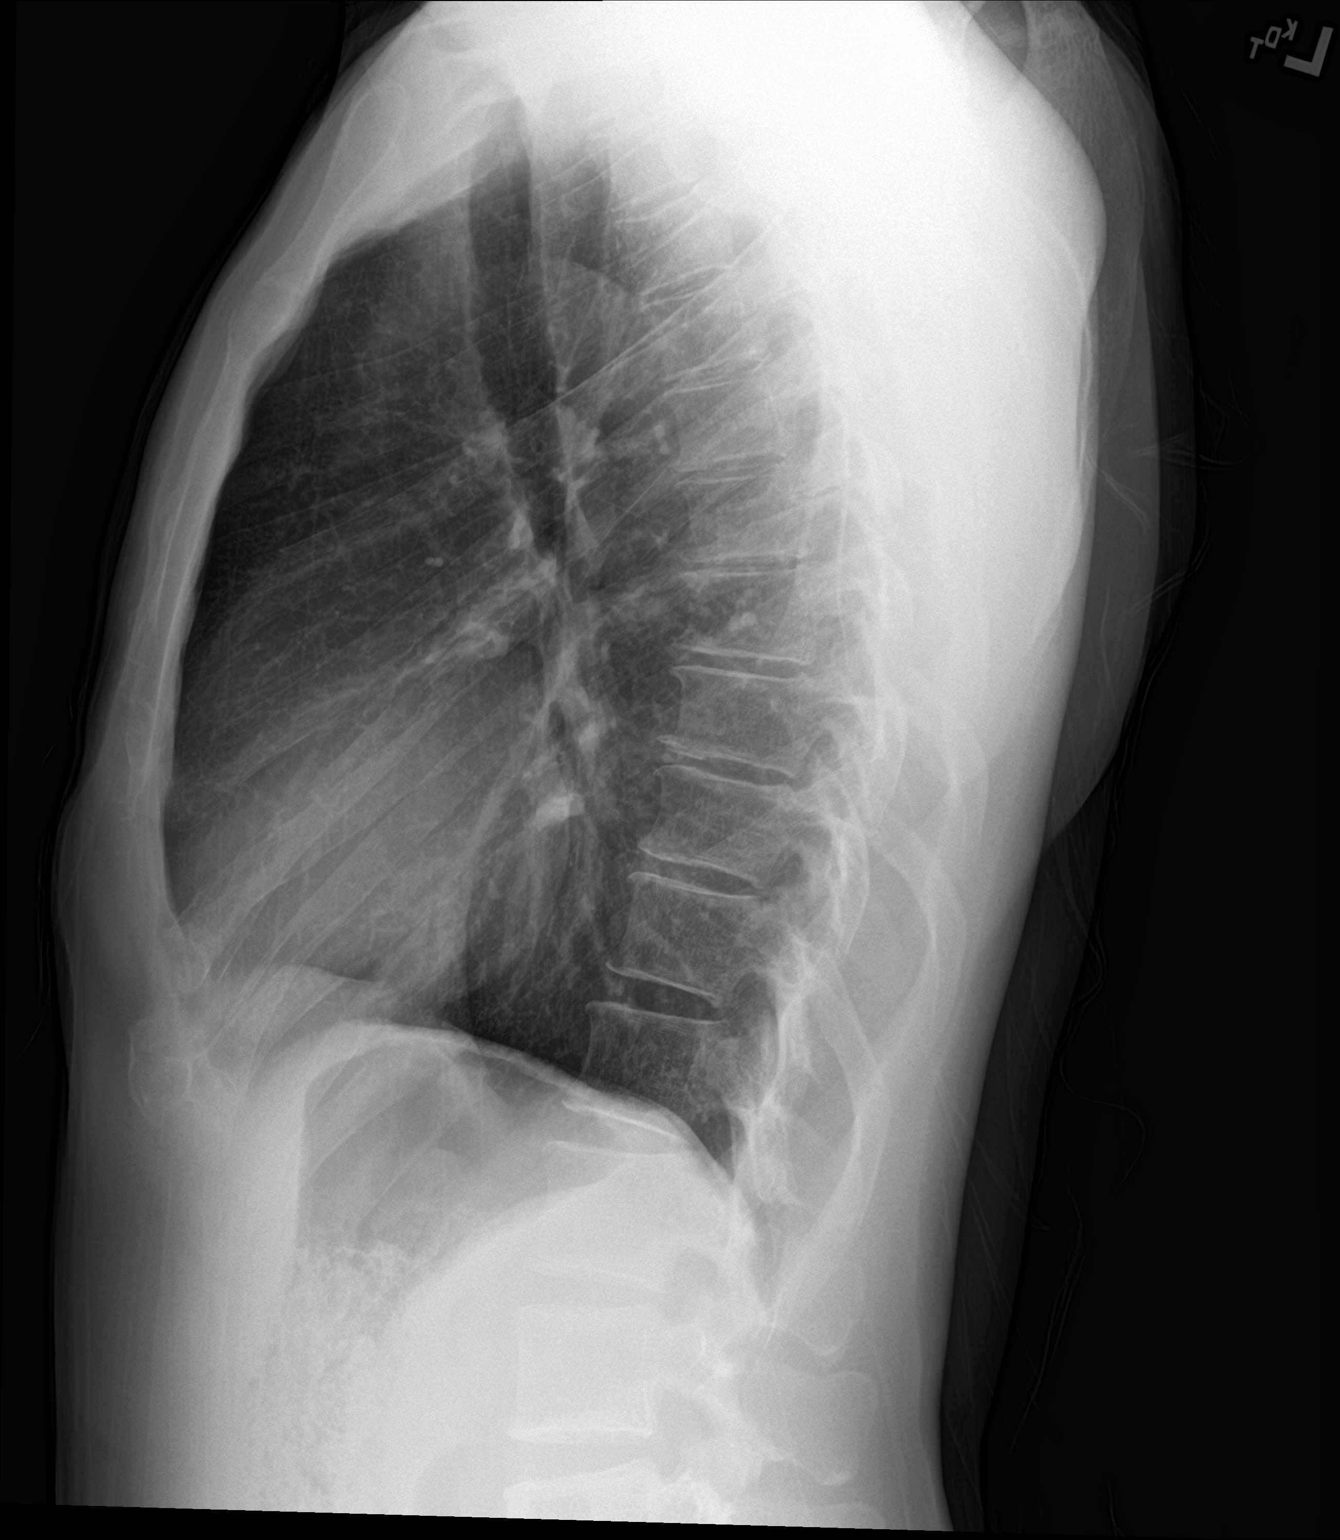

[2 of 2 positions shown; findings below may reference images not displayed]

FINDINGS: Lung volumes and mediastinal contours are stable since 2702, with
evidence of the right side aortic arch demonstrated by CT the year.
Visualized tracheal air column is within normal limits. Both lungs
appear clear. No pneumothorax or pleural effusion.

No acute osseous abnormality identified. Mild thoracic degenerative
endplate spurring but preserved disc spaces. Negative visible bowel
gas pattern.
IMPRESSION: Negative aside from congenital right side aortic arch. No
cardiopulmonary abnormality.
